# Patient Record
Sex: Male | Born: 1970 | Race: White | Hispanic: Yes | Marital: Married | Smoking: Current every day smoker
Health system: Southern US, Community
[De-identification: ages and names within clinical notes are randomized; demographics above are authoritative.]

## PROBLEM LIST (undated history)

## (undated) DIAGNOSIS — B353 Tinea pedis: Secondary | ICD-10-CM

## (undated) DIAGNOSIS — E119 Type 2 diabetes mellitus without complications: Secondary | ICD-10-CM

## (undated) DIAGNOSIS — E785 Hyperlipidemia, unspecified: Secondary | ICD-10-CM

## (undated) HISTORY — DX: Type 2 diabetes mellitus without complications: E11.9

## (undated) HISTORY — DX: Hyperlipidemia, unspecified: E78.5

## (undated) HISTORY — DX: Tinea pedis: B35.3

---

## 2006-08-22 ENCOUNTER — Emergency Department (HOSPITAL_COMMUNITY): Admission: EM | Admit: 2006-08-22 | Discharge: 2006-08-23 | Payer: Self-pay | Admitting: Emergency Medicine

## 2015-12-27 ENCOUNTER — Emergency Department (HOSPITAL_COMMUNITY)
Admission: EM | Admit: 2015-12-27 | Discharge: 2015-12-27 | Disposition: A | Discharge status: Home / Self Care | Payer: Self-pay | Attending: Emergency Medicine | Admitting: Emergency Medicine

## 2015-12-27 ENCOUNTER — Encounter (HOSPITAL_COMMUNITY): Payer: Self-pay | Admitting: Emergency Medicine

## 2015-12-27 ENCOUNTER — Emergency Department (HOSPITAL_COMMUNITY): Payer: Self-pay

## 2015-12-27 DIAGNOSIS — Y998 Other external cause status: Secondary | ICD-10-CM | POA: Insufficient documentation

## 2015-12-27 DIAGNOSIS — Z23 Encounter for immunization: Secondary | ICD-10-CM | POA: Insufficient documentation

## 2015-12-27 DIAGNOSIS — S0101XA Laceration without foreign body of scalp, initial encounter: Secondary | ICD-10-CM | POA: Insufficient documentation

## 2015-12-27 DIAGNOSIS — S0191XA Laceration without foreign body of unspecified part of head, initial encounter: Secondary | ICD-10-CM

## 2015-12-27 DIAGNOSIS — Y9389 Activity, other specified: Secondary | ICD-10-CM | POA: Insufficient documentation

## 2015-12-27 DIAGNOSIS — Y92009 Unspecified place in unspecified non-institutional (private) residence as the place of occurrence of the external cause: Secondary | ICD-10-CM | POA: Insufficient documentation

## 2015-12-27 DIAGNOSIS — F17200 Nicotine dependence, unspecified, uncomplicated: Secondary | ICD-10-CM | POA: Insufficient documentation

## 2015-12-27 MED ORDER — NAPROXEN 500 MG PO TABS: 500.0000 mg | ORAL_TABLET | Freq: Two times a day (BID) | ORAL | Status: DC

## 2015-12-27 MED ORDER — TETANUS-DIPHTH-ACELL PERTUSSIS 5-2.5-18.5 LF-MCG/0.5 IM SUSP
0.5000 mL | Freq: Once | INTRAMUSCULAR | Status: AC
  Administered 2015-12-27: 0.5 mL via INTRAMUSCULAR
  Filled 2015-12-27: qty 0.5

## 2015-12-27 MED ORDER — HYDROCODONE-ACETAMINOPHEN 5-325 MG PO TABS: 1.0000 | ORAL_TABLET | ORAL | Status: DC | PRN

## 2015-12-27 MED ORDER — HYDROCODONE-ACETAMINOPHEN 5-325 MG PO TABS
1.0000 | ORAL_TABLET | Freq: Once | ORAL | Status: AC
Start: 2015-12-27 — End: 2015-12-27
  Administered 2015-12-27: 1 via ORAL
  Filled 2015-12-27: qty 1
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
## 2015-12-27 NOTE — ED Notes (Signed)
[facility]  Pt calling for transport

## 2015-12-27 NOTE — Discharge Instructions (Signed)
Please return to the emergency room or go to any urgent care in one week for removal of the two (2) staples on your scalp. Return to the ER for new or worsening symptoms.   Puntos, grapas o Elijah Garcia para cerrar heridas (Stitches, Staples, or Adhesive Wound Closure) Los [employer] puntos (suturas), las grapas y ciertos pegamentos Sondra Barges cutneos) para Pharmacologist unida la piel mientras cicatriza (cierre de la herida). Tal vez sea necesario este tratamiento despus de [place] o si se produce un corte accidental en la piel. Estos mtodos ayudan a que la piel cicatrice con mayor rapidez y reducen la probabilidad de que se forme una Training and development officer. Una herida puede demorar varios meses en cicatrizar por completo. El tipo de herida que tenga determina cundo debe cerrarse. En la [employer], la herida se cierra lo antes posible (cierre definitivo de la piel). A veces, el cierre se retrasa para poder limpiar la herida y dejar que cicatrice naturalmente. Esto reduce la probabilidad de infecciones. Puede ser necesario retardar el cierre si la herida:  Es causada por una mordedura o [place].  Se produjo hace ms de 6horas.  Hay prdida de piel o de tejidos subcutneos.  Hay suciedad o partculas que no pueden retirarse.  Est infectada. CULES SON LAS DIFERENTES CLASES DE CIERRES DE HERIDAS? Hay muchas opciones para cerrar heridas. La alternativa que el mdico [place] depende de la profundidad y el tamao de la herida. Maryln Gottron cerrar Neomia Dear herida con este tipo de [place], el mdico mantiene [employer] bordes de la herida y Microbiologist una capa del producto en la superficie de la piel. Tal vez se necesite ms de una capa de [place]. Luego, la herida puede cubrirse con un apsito (vendaje) pequeo. Este tipo de cierre de la piel puede usarse para las heridas pequeas que no son profundas (superficiales). Usar [employer] para cerrar heridas es menos doloroso que otros mtodos y no requiere  de la aplicacin de un medicamento para anestesiar la zona (anestesia local). Adems, con este mtodo, no hay nada que retirar. El adhesivo suele usarse en los nios y en las heridas de la cara. Esta opcin no puede usarse en las heridas profundas, irregulares o que sangran. No se aplica en el interior de una herida.  Tiras ZOXWRUEAV Estas tiras estn hechas de papel engomado Elijah Garcia) y poroso. Se colocan cruzadas [employer] bordes de la piel, como un vendaje adhesivo normal. Se dejan hasta que se despegan. Las tiras [facility] [facility] pueden usarse para cerrar heridas muy superficiales. Tambin pueden utilizarse junto con suturas para Scientist, clinical (histocompatibility and immunogenetics) el cierre de los bordes de la piel.  Suturas Las suturas son el mtodo ms antiguo para Automotive engineer las heridas. El material de sutura puede estar hecho de sustancias biolgicas, como la seda, o de materiales sintticos, como el nailon y Civil engineer, contracting. Puede estar hecho de un material que el cuerpo puede degradar a medida que la herida cicatriza (reabsorbible), o bien, de un material que debe retirarse de la piel (no reabsorbible). Tiene muchas resistencias y Therapist, music. El mdico enhebra el hilo de sutura en un extremo de una aguja de acero. Las suturas pueden pasarse a travs de la piel o los tejidos subcutneos. Luego se atan y se cortan. Los bordes de la piel pueden cerrarse con un punto continuo o con puntos separados. Las suturas son resistentes y pueden usarse para todo tipo de herida. Las suturas reabsorbibles pueden utilizarse para cerrar los tejidos subcutneos. La desventaja de las suturas  es que pueden causar reacciones en la piel que derivan en infecciones. Las suturas no reabsorbibles deben retirarse. Grapas Cuando se usan grapas quirrgicas para cerrar una herida, se acercan los bordes de la piel de ambos lados de la herida y se Music therapistcierran. Se coloca una grapa a travs de la herida, y, con un instrumento, se abrochan los bordes. Las grapas suelen usarse para  cerrar cortes quirrgicos (incisiones). Se colocan con mayor rapidez que las suturas y causan menos reacciones en la piel. Deben retirarse con una herramienta que las abre y permite quitarlas de la piel. CMO DEBO CUIDARME EL CIERRE DE LA HERIDA?  Tome los medicamentos solamente como se lo haya indicado el mdico.  Si le recetaron antibiticos para la herida, asegrese de terminarlos, incluso si comienza a sentirse mejor.  Aplique ungentos o cremas como se lo haya indicado el mdico.  Lvese las manos con agua y [place], antes y despus de tocarse la herida.  No sumerja la herida en agua. No tome baos de inmersin, no nade ni use el jacuzzi hasta que el mdico lo autorice.  Pregntele al mdico cundo puede empezar a ducharse. Cbrase la herida, si el mdico se lo indic.  No se quite las suturas ni las grapas.  No se toque la herida. Esto puede causar una infeccin.  Concurra a todas las visitas de control como se lo haya indicado el mdico. Esto es importante. CUNTO TIEMPO TENDR EL CIERRE DE LA HERIDA?  Djese el Elijah Danielsadhesivo en la piel hasta que se desprenda solo.  Djese las tiras Agilent Technologiesadhesivas en la piel hasta que se despeguen.  Las suturas reabsorbibles se Administrator, sportsdisolvern en el trmino de Time Warneralgunos das.  Las suturas no reabsorbibles y las grapas deben retirarse. El Environmental consultantlugar de la herida Warehouse managerdeterminar el tiempo que deben dejarse, el cual puede variar entre [address]varios das y un par de Retail buyersemanas. CUNDO DEBO SOLICITAR AYUDA PARA EL CIERRE DE LA HERIDA? Comunquese con su mdico si:  Tiene fiebre.  Tiene escalofros.  Hay secrecin, enrojecimiento, hinchazn o dolor en la herida.  Advierte un olor ftido que proviene de la herida.  Los bordes de la piel empiezan a separarse despus de retiradas las suturas.  La herida se engrosa, se abulta y se oscurece despus de retiradas las suturas (deformidad cicatricial).   Esta informacin no tiene Theme park managercomo fin reemplazar el consejo del mdico.  Asegrese de hacerle al mdico cualquier pregunta que tenga.   Document Released: 06/08/2005 Document Revised: 09/19/2014 Elsevier Interactive Patient Education [employer]2016 Elsevier Inc.
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
## 2018-09-16 ENCOUNTER — Emergency Department (HOSPITAL_COMMUNITY): Payer: Self-pay

## 2018-09-16 ENCOUNTER — Other Ambulatory Visit: Payer: Self-pay

## 2018-09-16 ENCOUNTER — Encounter (HOSPITAL_COMMUNITY): Payer: Self-pay

## 2018-09-16 ENCOUNTER — Emergency Department (HOSPITAL_COMMUNITY)
Admission: EM | Admit: 2018-09-16 | Discharge: 2018-09-17 | Disposition: A | Discharge status: Home / Self Care | Payer: Self-pay | Attending: Emergency Medicine | Admitting: Emergency Medicine

## 2018-09-16 DIAGNOSIS — F17210 Nicotine dependence, cigarettes, uncomplicated: Secondary | ICD-10-CM | POA: Insufficient documentation

## 2018-09-16 DIAGNOSIS — E119 Type 2 diabetes mellitus without complications: Secondary | ICD-10-CM | POA: Insufficient documentation

## 2018-09-16 DIAGNOSIS — N200 Calculus of kidney: Secondary | ICD-10-CM | POA: Insufficient documentation

## 2018-09-16 DIAGNOSIS — Z79899 Other long term (current) drug therapy: Secondary | ICD-10-CM | POA: Insufficient documentation

## 2018-09-16 HISTORY — DX: Type 2 diabetes mellitus without complications: E11.9

## 2018-09-16 LAB — URINALYSIS, ROUTINE W REFLEX MICROSCOPIC
Bilirubin Urine: NEGATIVE
Ketones, ur: NEGATIVE mg/dL
Leukocytes, UA: NEGATIVE
Nitrite: NEGATIVE
PROTEIN: 30 mg/dL — AB
SPECIFIC GRAVITY, URINE: 1.032 — AB (ref 1.005–1.030)
pH: 6 (ref 5.0–8.0)

## 2018-09-16 LAB — COMPREHENSIVE METABOLIC PANEL
ALBUMIN: 4.1 g/dL (ref 3.5–5.0)
ALK PHOS: 56 U/L (ref 38–126)
ALT: 36 U/L (ref 0–44)
AST: 29 U/L (ref 15–41)
Anion gap: 7 (ref 5–15)
BILIRUBIN TOTAL: 0.7 mg/dL (ref 0.3–1.2)
BUN: 12 mg/dL (ref 6–20)
CALCIUM: 8.9 mg/dL (ref 8.9–10.3)
CO2: 23 mmol/L (ref 22–32)
CREATININE: 1.01 mg/dL (ref 0.61–1.24)
Chloride: 103 mmol/L (ref 98–111)
GFR calc Af Amer: >60 mL/min (ref >60)
GFR calc non Af Amer: >60 mL/min (ref >60)
GLUCOSE: 325 mg/dL — AB (ref 70–99)
Potassium: 4.3 mmol/L (ref 3.5–5.1)
SODIUM: 133 mmol/L — AB (ref 135–145)
Total Protein: 7.5 g/dL (ref 6.5–8.1)

## 2018-09-16 LAB — LIPASE, BLOOD: Lipase: 31 U/L (ref 11–51)

## 2018-09-16 LAB — CBC
HCT: 46.2 % (ref 39.0–52.0)
Hemoglobin: 15.7 g/dL (ref 13.0–17.0)
MCH: 30.5 pg (ref 26.0–34.0)
MCHC: 34 g/dL (ref 30.0–36.0)
MCV: 89.7 fL (ref 80.0–100.0)
PLATELETS: 269 10*3/uL (ref 150–400)
RBC: 5.15 MIL/uL (ref 4.22–5.81)
RDW: 12.4 % (ref 11.5–15.5)
WBC: 6.2 10*3/uL (ref 4.0–10.5)
nRBC: 0 % (ref 0.0–0.2)

## 2018-09-16 MED ORDER — IOHEXOL 300 MG/ML  SOLN
100.0000 mL | Freq: Once | INTRAMUSCULAR | Status: AC | PRN
  Administered 2018-09-16: 100 mL via INTRAVENOUS

## 2018-09-16 MED ORDER — ONDANSETRON HCL 4 MG/2ML IJ SOLN
4.0000 mg | Freq: Once | INTRAMUSCULAR | Status: AC
  Administered 2018-09-16: 4 mg via INTRAVENOUS
  Filled 2018-09-16: qty 2

## 2018-09-16 MED ORDER — SODIUM CHLORIDE 0.9 % IV SOLN
1.0000 g | Freq: Once | INTRAVENOUS | Status: AC
  Administered 2018-09-16: 1 g via INTRAVENOUS
  Filled 2018-09-16: qty 10

## 2018-09-16 MED ORDER — SODIUM CHLORIDE 0.9 % IV BOLUS
1000.0000 mL | Freq: Once | INTRAVENOUS | Status: AC
  Administered 2018-09-16: 1000 mL via INTRAVENOUS

## 2018-09-16 MED ORDER — MORPHINE SULFATE (PF) 4 MG/ML IV SOLN
4.0000 mg | Freq: Once | INTRAVENOUS | Status: AC
  Administered 2018-09-16: 4 mg via INTRAVENOUS
  Filled 2018-09-16: qty 1

## 2018-09-16 MED ORDER — OXYCODONE-ACETAMINOPHEN 5-325 MG PO TABS
2.0000 | ORAL_TABLET | Freq: Once | ORAL | Status: AC
  Administered 2018-09-16: 2 via ORAL
  Filled 2018-09-16: qty 2

## 2018-09-16 NOTE — ED Triage Notes (Signed)
Pt arrives POV for eval of abd pain w/ blt flank pain onset last night. Pt reports 7 episodes of emesis since yesterday afternoon. Pt denies dysuria, hematuria. Denies CP/SOB.

## 2018-09-16 NOTE — ED Provider Notes (Signed)
[facility] EMERGENCY DEPARTMENT Provider Note   CSN: [id number] Arrival date & time: 09/16/18  1640     History   Chief Complaint Chief Complaint  Patient presents with  . Abdominal Pain    HPI Elijah Garcia is a 48 y.o. male.  The history is provided by the patient and medical records. No language interpreter was used.  Abdominal Pain   Associated symptoms include nausea and vomiting. Pertinent negatives include diarrhea, constipation, dysuria, frequency and hematuria.   Elijah Garcia is a 48 y.o. male  with a PMH of DM who presents to the Emergency Department complaining of generalized abdominal pain which began yesterday.  Reports bilateral flank/back pain as well which is described as aching. Associated with nausea as well as vomiting.  He states that he threw up about 4 times yesterday and about 3 times thus far today.  He took Tylenol with little improvement.  Denies any history of similar.  No history of abdominal surgeries.  Denies any chest pain, shortness of breath, dysuria.  Denies any aggravating factors.  Past Medical History:  Diagnosis Date  . Diabetes mellitus without complication (HCC)     There are no active problems to display for this patient.   History reviewed. No pertinent surgical history.      Home Medications    Prior to Admission medications   Medication Sig Start Date End Date Taking? Authorizing Provider  HYDROcodone-acetaminophen (NORCO/VICODIN) 5-325 MG tablet Take 1 tablet by mouth every 4 (four) hours as needed. 12/27/15   Sam, Ace Gins, PA-C  ibuprofen (ADVIL,MOTRIN) 200 MG tablet Take 400 mg by mouth every 6 (six) hours as needed (for pain.).    [provider]  naproxen (NAPROSYN) 500 MG tablet Take 1 tablet (500 mg total) by mouth 2 (two) times daily. 12/27/15   Carlene Coria, PA-C    Family History History reviewed. No pertinent family history.  Social History Social History    Tobacco Use  . Smoking status: Current Every Day Smoker  . Smokeless tobacco: Never Used  Substance Use Topics  . Alcohol use: Yes  . Drug use: No     Allergies   Patient has no known allergies.   Review of Systems Review of Systems  Gastrointestinal: Positive for abdominal pain, nausea and vomiting. Negative for blood in stool, constipation and diarrhea.  Genitourinary: Positive for flank pain. Negative for difficulty urinating, discharge, dysuria, frequency, hematuria, penile pain, penile swelling, scrotal swelling, testicular pain and urgency.  Musculoskeletal: Positive for back pain. Negative for neck pain.  All other systems reviewed and are negative.    Physical Exam Updated Vital Signs BP (!) 142/89 (BP Location: Right Arm)   Pulse 62   Temp 98.3 F (36.8 C) (Oral)   Resp 18   Ht 5' 6.54" (1.69 m)   Wt 86.2 kg   SpO2 99%   BMI 30.18 kg/m   Physical Exam Vitals signs and nursing note reviewed.  Constitutional:      General: He is not in acute distress.    Appearance: He is well-developed.  HENT:     Head: Normocephalic and atraumatic.  Cardiovascular:     Rate and Rhythm: Normal rate and regular rhythm.     Heart sounds: Normal heart sounds. No murmur.  Pulmonary:     Effort: Pulmonary effort is normal. No respiratory distress.     Breath sounds: Normal breath sounds.  Abdominal:     General: There is no  distension.     Palpations: Abdomen is soft.     Comments: Generalized abdominal tenderness.  Skin:    General: Skin is warm and dry.  Neurological:     Mental Status: He is alert and oriented to person, place, and time.      ED Treatments / Results  Labs (all labs ordered are listed, but only abnormal results are displayed) Labs Reviewed  COMPREHENSIVE METABOLIC PANEL - Abnormal; Notable for the following components:      Result Value   Sodium 133 (*)    Glucose, Bld 325 (*)    All other components within normal limits  URINALYSIS,  ROUTINE W REFLEX MICROSCOPIC - Abnormal; Notable for the following components:   Specific Gravity, Urine 1.032 (*)    Glucose, UA >=500 (*)    Hgb urine dipstick LARGE (*)    Protein, ur 30 (*)    RBC / HPF >50 (*)    Bacteria, UA RARE (*)    All other components within normal limits  LIPASE, BLOOD  CBC    EKG None  Radiology No results found.  Procedures Procedures (including critical care time)  Medications Ordered in ED Medications  morphine 4 MG/ML injection 4 mg (4 mg Intravenous Given 09/16/18 1754)  ondansetron (ZOFRAN) injection 4 mg (4 mg Intravenous Given 09/16/18 1755)  sodium chloride 0.9 % bolus 1,000 mL (0 mLs Intravenous Stopped 09/16/18 2057)     Initial Impression / Assessment and Plan / ED Course  I have reviewed the triage vital signs and the nursing notes.  Pertinent labs & imaging results that were available during my care of the patient were reviewed by me and considered in my medical decision making (see chart for details).    Elijah Garcia is a 48 y.o. male who presents to ED for abdominal pain, nausea and vomiting which began yesterday. On exam, and is afebrile, hemodynamically stable with generalized abdominal tenderness.  Labs reviewed and notable for hyperglycemia at 325 without evidence of DKA.  He does have history of diabetes.  Normal white count.  Urinalysis with large amount of hemoglobin and red blood cells.  Possible kidney stone as etiology. CT abd/pelvis pending at shift change. Care assumed by attending, Dr. Donnald Garre who will follow up on pending imaging and dispo appropriately.    Final Clinical Impressions(s) / ED Diagnoses   Final diagnoses:  None    ED Discharge Orders    None       Ward, Chase Picket, PA-C 09/16/18 2155    Arby Barrette, MD 09/20/18 [phone]

## 2018-09-16 NOTE — ED Notes (Signed)
Patient transported to CT 

## 2018-09-17 MED ORDER — CEPHALEXIN 500 MG PO CAPS: 1000.0000 mg | ORAL_CAPSULE | Freq: Two times a day (BID) | ORAL | 0 refills | Status: DC

## 2018-09-17 MED ORDER — OXYCODONE-ACETAMINOPHEN 5-325 MG PO TABS: 1.0000 | ORAL_TABLET | Freq: Four times a day (QID) | ORAL | 0 refills | Status: DC | PRN

## 2018-09-17 MED ORDER — ONDANSETRON 4 MG PO TBDP: 4.0000 mg | ORAL_TABLET | ORAL | 0 refills | Status: DC | PRN

## 2018-09-17 NOTE — Discharge Instructions (Addendum)
1.  Call Dr. Alphonsa Overall office tomorrow morning at [phone]. This is a Insurance underwriter. You need an appointment in the next 1-3 days.  Dr. Liliane Shi is the doctor who was called when you were in the emergency department.  You can be seen by the first available doctor at the urologist office. 2.  You may take Percocet as prescribed for pain.  Take Keflex, an antibiotic twice daily as prescribed.  You were also given prescription of Zofran which is for nausea. 3.  If your symptoms are getting worse or you feel that your pain is getting worse, return to the emergency department.

## 2018-09-18 LAB — URINE CULTURE: Culture: NO GROWTH

## 2019-05-23 ENCOUNTER — Other Ambulatory Visit: Payer: Self-pay | Admitting: Pediatrics

## 2019-05-23 MED ORDER — PERMETHRIN 5 % EX CREA: 1.0000 "application " | TOPICAL_CREAM | Freq: Once | CUTANEOUS | 0 refills | Status: AC

## 2019-05-23 NOTE — Progress Notes (Signed)
Household contact of scabies  

## 2019-07-21 ENCOUNTER — Emergency Department (HOSPITAL_COMMUNITY): Payer: Self-pay

## 2019-07-21 ENCOUNTER — Emergency Department (HOSPITAL_COMMUNITY)
Admission: EM | Admit: 2019-07-21 | Discharge: 2019-07-21 | Disposition: A | Discharge status: Home / Self Care | Payer: Self-pay | Attending: Emergency Medicine | Admitting: Emergency Medicine

## 2019-07-21 ENCOUNTER — Other Ambulatory Visit: Payer: Self-pay

## 2019-07-21 ENCOUNTER — Encounter (HOSPITAL_COMMUNITY): Payer: Self-pay

## 2019-07-21 DIAGNOSIS — Z7984 Long term (current) use of oral hypoglycemic drugs: Secondary | ICD-10-CM | POA: Insufficient documentation

## 2019-07-21 DIAGNOSIS — E119 Type 2 diabetes mellitus without complications: Secondary | ICD-10-CM | POA: Insufficient documentation

## 2019-07-21 DIAGNOSIS — F17210 Nicotine dependence, cigarettes, uncomplicated: Secondary | ICD-10-CM | POA: Insufficient documentation

## 2019-07-21 DIAGNOSIS — L600 Ingrowing nail: Secondary | ICD-10-CM | POA: Insufficient documentation

## 2019-07-21 LAB — BASIC METABOLIC PANEL
Anion gap: 14 (ref 5–15)
BUN: 16 mg/dL (ref 6–20)
CO2: 18 mmol/L — ABNORMAL LOW (ref 22–32)
Calcium: 9.1 mg/dL (ref 8.9–10.3)
Chloride: 102 mmol/L (ref 98–111)
Creatinine, Ser: 0.64 mg/dL (ref 0.61–1.24)
GFR calc Af Amer: >60 mL/min (ref >60)
GFR calc non Af Amer: >60 mL/min (ref >60)
Glucose, Bld: 435 mg/dL — ABNORMAL HIGH (ref 70–99)
Potassium: 4 mmol/L (ref 3.5–5.1)
Sodium: 134 mmol/L — ABNORMAL LOW (ref 135–145)

## 2019-07-21 LAB — CBC WITH DIFFERENTIAL/PLATELET
Abs Immature Granulocytes: 0.01 10*3/uL (ref 0.00–0.07)
Basophils Absolute: 0 10*3/uL (ref 0.0–0.1)
Basophils Relative: 1 %
Eosinophils Absolute: 0 10*3/uL (ref 0.0–0.5)
Eosinophils Relative: 1 %
HCT: 47.9 % (ref 39.0–52.0)
Hemoglobin: 16.9 g/dL (ref 13.0–17.0)
Immature Granulocytes: 0 %
Lymphocytes Relative: 37 %
Lymphs Abs: 1.4 10*3/uL (ref 0.7–4.0)
MCH: 31.4 pg (ref 26.0–34.0)
MCHC: 35.3 g/dL (ref 30.0–36.0)
MCV: 88.9 fL (ref 80.0–100.0)
Monocytes Absolute: 0.2 10*3/uL (ref 0.1–1.0)
Monocytes Relative: 6 %
Neutro Abs: 2 10*3/uL (ref 1.7–7.7)
Neutrophils Relative %: 55 %
Platelets: 240 10*3/uL (ref 150–400)
RBC: 5.39 MIL/uL (ref 4.22–5.81)
RDW: 11.9 % (ref 11.5–15.5)
WBC: 3.7 10*3/uL — ABNORMAL LOW (ref 4.0–10.5)
nRBC: 0 % (ref 0.0–0.2)

## 2019-07-21 MED ORDER — METFORMIN HCL 500 MG PO TABS: 500.0000 mg | ORAL_TABLET | Freq: Two times a day (BID) | ORAL | 0 refills | Status: DC

## 2019-07-21 MED ORDER — LIDOCAINE HCL (PF) 1 % IJ SOLN
30.0000 mL | Freq: Once | INTRAMUSCULAR | Status: AC
  Administered 2019-07-21: 13:00:00 30 mL
  Filled 2019-07-21: qty 30

## 2019-07-21 MED ORDER — CEPHALEXIN 250 MG PO CAPS
500.0000 mg | ORAL_CAPSULE | Freq: Once | ORAL | Status: AC
  Administered 2019-07-21: 500 mg via ORAL
  Filled 2019-07-21: qty 2

## 2019-07-21 MED ORDER — ACETAMINOPHEN 325 MG PO TABS
650.0000 mg | ORAL_TABLET | Freq: Once | ORAL | Status: AC
  Administered 2019-07-21: 12:00:00 650 mg via ORAL
  Filled 2019-07-21: qty 2

## 2019-07-21 MED ORDER — METFORMIN HCL 500 MG PO TABS
500.0000 mg | ORAL_TABLET | Freq: Once | ORAL | Status: AC
  Administered 2019-07-21: 500 mg via ORAL
  Filled 2019-07-21: qty 1

## 2019-07-21 MED ORDER — CEPHALEXIN 500 MG PO CAPS: 1000.0000 mg | ORAL_CAPSULE | Freq: Two times a day (BID) | ORAL | 0 refills | Status: AC

## 2019-07-21 NOTE — Discharge Instructions (Addendum)
You have been seen today for tow infection. Please read and follow all provided instructions. Return to the emergency room for worsening condition or new concerning symptoms including fever, chills, worsening pain, surrounding redness or purulent drainage  1. Medications:  -Prescription sent to your pharmacy for Keflex.  This is an antibiotic for your toe infection.  Please take as prescribed. -Prescription also sent for Metformin.  Please start taking this medication for your diabetes. It is important that you start checking your blood sugars daily  Continue usual home medications Take medications as prescribed. Please review all of the medicines and only take them if you do not have an allergy to them.   2. Treatment: rest, drink plenty of fluids. Warm water soaks for your toe twice daily 3. Follow Up: Please follow-up with the [facility] health clinic as we discussed.  You are given a flyer today and you need to call on Monday to schedule an appointment.  They see patients without insurance. -Please also follow-up with the doctor Dr. Amalia Hailey.  Your toe is infected today and will likely need further treatment.  It is also a possibility that you have an allergic reaction to any of the medicines that you have been prescribed - Everybody reacts differently to medications and while MOST people have no trouble with most medicines, you may have a reaction such as nausea, vomiting, rash, swelling, shortness of breath. If this is the case, please stop taking the medicine immediately and contact your physician.  ?

## 2019-07-21 NOTE — ED Triage Notes (Signed)
Patient complains of left great toe pain x 2 weeks, complains of pain around nail bed, denies trauma

## 2019-07-21 NOTE — ED Notes (Signed)
Patient verbalizes understanding of discharge instructions . Opportunity for questions and answers were provided . Armband removed by staff ,Pt discharged from ED. W/C  offered at D/C  and Declined W/C at D/C and was escorted to lobby by RN.  

## 2019-07-21 NOTE — ED Notes (Signed)
ED Provider at bedside. 

## 2019-07-21 NOTE — ED Provider Notes (Signed)
[facility]   CSN: [id number] Arrival date & time: 07/21/19  1038     History   Chief Complaint Foot pain  HPI Elijah Garcia is a 48 y.o. male with past medical history significant for diabetes presents to emergency department today with chief complaint of foot pain x2 weeks.  Pain is located in his left great toe.  He describes the pain as intermittent, throbbing, worse with movement.  He has had purulent drainage from wound. He rates pain 8/10 in severity. He took ibuprofen without symptom relief.  He denies any trauma to the toe or injury. He has not taken his Metformin in the last 3 or so months because he ran out, he has not been checking his blood sugars.   He denies fever, chills, chest pain, abdominal pain, nausea, vomiting, urinary symptoms, diarrhea, numbness, weakness.       Past Medical History:  Diagnosis Date  . Diabetes mellitus without complication (HCC)     There are no active problems to display for this patient.   History reviewed. No pertinent surgical history.      Home Medications    Prior to Admission medications   Medication Sig Start Date End Date Taking? Authorizing Provider  cephALEXin (KEFLEX) 500 MG capsule Take 2 capsules (1,000 mg total) by mouth 2 (two) times daily for 7 days. 07/21/19 07/28/19  Keliyah Spillman E, PA-C  HYDROcodone-acetaminophen (NORCO/VICODIN) 5-325 MG tablet Take 1 tablet by mouth every 4 (four) hours as needed. Patient not taking: Reported on 09/16/2018 12/27/15   Sam, Ace GinsSerena Y, PA-C  ibuprofen (ADVIL,MOTRIN) 200 MG tablet Take 400 mg by mouth every 6 (six) hours as needed (pain).     [provider]  metFORMIN (GLUCOPHAGE) 500 MG tablet Take 1 tablet (500 mg total) by mouth 2 (two) times daily with a meal. 07/21/19 08/20/19  Latonia Conrow E, PA-C  naproxen (NAPROSYN) 500 MG tablet Take 1 tablet (500 mg total) by mouth 2 (two) times daily. Patient  not taking: Reported on 09/16/2018 12/27/15   Sam, Ace GinsSerena Y, PA-C  ondansetron (ZOFRAN ODT) 4 MG disintegrating tablet Take 1 tablet (4 mg total) by mouth every 4 (four) hours as needed for nausea or vomiting. 09/17/18   Arby BarrettePfeiffer, Marcy, MD  oxyCODONE-acetaminophen (PERCOCET) 5-325 MG tablet Take 1-2 tablets by mouth every 6 (six) hours as needed. 09/17/18   Arby BarrettePfeiffer, Marcy, MD    Family History No family history on file.  Social History Social History   Tobacco Use  . Smoking status: Current Every Day Smoker  . Smokeless tobacco: Never Used  Substance Use Topics  . Alcohol use: Yes  . Drug use: No     Allergies   Patient has no known allergies.   Review of Systems Review of Systems  Constitutional: Negative for chills, diaphoresis, fatigue and fever.  Respiratory: Negative for cough and shortness of breath.   Cardiovascular: Negative for chest pain.  Gastrointestinal: Negative for abdominal pain, nausea and vomiting.  Endocrine: Negative for polydipsia, polyphagia and polyuria.  Musculoskeletal: Positive for arthralgias.  Skin: Positive for wound.  Allergic/Immunologic: Positive for immunocompromised state (diabetic).  Neurological: Negative for weakness and numbness.     Physical Exam Updated Vital Signs BP (!) 142/104 (BP Location: Right Arm)   Pulse 90   Temp 98.2 F (36.8 C) (Oral)   Resp 12   SpO2 100%   Physical Exam Vitals signs and nursing note reviewed.  Constitutional:  Appearance: He is well-developed. He is not ill-appearing or toxic-appearing.  HENT:     Head: Normocephalic and atraumatic.     Nose: Nose normal.  Eyes:     General: No scleral icterus.       Right eye: No discharge.        Left eye: No discharge.     Conjunctiva/sclera: Conjunctivae normal.  Neck:     Musculoskeletal: Normal range of motion.     Vascular: No JVD.  Cardiovascular:     Rate and Rhythm: Normal rate and regular rhythm.     Pulses: Normal pulses.          Dorsalis  pedis pulses are 2+ on the right side and 2+ on the left side.     Heart sounds: Normal heart sounds.  Pulmonary:     Effort: Pulmonary effort is normal.     Breath sounds: Normal breath sounds.  Abdominal:     General: There is no distension.  Musculoskeletal: Normal range of motion.  Feet:     Right foot:     Skin integrity: Callus and dry skin present.     Toenail Condition: Right toenails are abnormally thick.     Left foot:     Skin integrity: Callus and dry skin present.     Toenail Condition: Left toenails are abnormally thick.     Comments: Sensation intact to bilateral feet.  Able to wiggle toes, cap refill.  Wound to medial aspect of left great toe nail with minimal bleeding and purulent drainage. Tender to palpation, warm to the touch. Please see media below Skin:    General: Skin is warm and dry.  Neurological:     Mental Status: He is oriented to person, place, and time.     GCS: GCS eye subscore is 4. GCS verbal subscore is 5. GCS motor subscore is 6.     Comments: Fluent speech, no facial droop.  Psychiatric:        Behavior: Behavior normal.        ED Treatments / Results  Labs (all labs ordered are listed, but only abnormal results are displayed) Labs Reviewed  CBC WITH DIFFERENTIAL/PLATELET - Abnormal; Notable for the following components:      Result Value   WBC 3.7 (*)    All other components within normal limits  BASIC METABOLIC PANEL - Abnormal; Notable for the following components:   Sodium 134 (*)    CO2 18 (*)    Glucose, Bld 435 (*)    All other components within normal limits    EKG None  Radiology Dg Foot Complete Left  Result Date: 07/21/2019 CLINICAL DATA:  Patient complains of left great toe pain x 2 weeks, complains of pain around nail bed, denies trauma. EXAM: LEFT FOOT - COMPLETE 3+ VIEW COMPARISON:  None. FINDINGS: There is no evidence of fracture or dislocation. There is no evidence of arthropathy or other focal bone abnormality.  Soft tissues are unremarkable. IMPRESSION: No radiographic finding to explain the patient's left great toe pain. Electronically Signed   By: Emmaline Kluver M.D.   On: 07/21/2019 12:24    Procedures .Marland KitchenIncision and Drainage  Date/Time: 07/21/2019 1:34 PM Performed by: Sherene Sires, PA-C Authorized by: Sherene Sires, PA-C   Consent:    Consent obtained:  Verbal   Consent given by:  Patient   Risks discussed:  Bleeding, incomplete drainage, pain and infection   Alternatives discussed:  No treatment Location:  Type:  Fluid collection   Size:  .5 x .5   Location:  Lower extremity   Lower extremity location:  Toe   Toe location:  L big toe Pre-procedure details:    Skin preparation:  Chloraprep Anesthesia (see MAR for exact dosages):    Anesthesia method:  Local infiltration   Local anesthetic:  Lidocaine 1% w/o epi Procedure type:    Complexity:  Simple Procedure details:    Incision types:  Single straight   Incision depth:  Dermal   Scalpel blade:  11   Wound management:  Probed and deloculated, irrigated with saline and extensive cleaning   Drainage:  Purulent   Drainage amount:  Scant   Wound treatment:  Wound left open   Packing materials:  None Post-procedure details:    Patient tolerance of procedure:  Tolerated well, no immediate complications   (including critical care time)  Medications Ordered in ED Medications  cephALEXin (KEFLEX) capsule 500 mg (has no administration in time range)  metFORMIN (GLUCOPHAGE) tablet 500 mg (has no administration in time range)  acetaminophen (TYLENOL) tablet 650 mg (650 mg Oral Given 07/21/19 1224)  lidocaine (PF) (XYLOCAINE) 1 % injection 30 mL (30 mLs Infiltration Given 07/21/19 1307)     Initial Impression / Assessment and Plan / ED Course  I have reviewed the triage vital signs and the nursing notes.  Pertinent labs & imaging results that were available during my care of the patient were reviewed by me and  considered in my medical decision making (see chart for details).  Patient seen and examined. Patient well-appearing, no acute distress, nontoxic appearing.  He is afebrile.  Lungs are clear to auscultation all fields abdomen is soft nontender, no peritoneal signs.  He is willing to left great toe at the lateral aspect of nail bed assistant with ingrown toenail.  Given his diabetic history with noncompliance will get basic labs and x-ray to rule out osteo or deep space infection.  CBC unremarkable. BMP shows hyperglycemia 435, normal anion gap, normal kidney function. Xray viewed by me is negative for fracture, dislocation, doubt signs of osteo.  Discussed patient's elevated blood sugar today.  He does not want IV fluids.  He would rather restart his Metformin.  Will give dose of metformin and Keflex for toe infection.  I&D performed, please see note above.  Patient tolerated well.  Purulent discharge expressed from wound.  Patient discussed with case management Crystal as he does not have insurance or PCP.  She will give patient admitted for [facility] health clinic and [facility] aware that patient will be calling to schedule an appointment.  Patient given 1 month of Metformin at discharge as well as prescription for Keflex. The patient appears reasonably screened and/or stabilized for discharge and I doubt any other medical condition or other [facility] requiring further screening, evaluation, or treatment in the ED at this time prior to discharge. The patient is safe for discharge with strict return precautions discussed.  Discussed at length the importance of PCP follow-up to get patient's blood sugars under control.  Also given information for podiatry and recommend follow up for toe infection today.   Portions of this note were generated with Lobby[employer]. Dictation errors may occur despite best attempts at proofreading.   Final Clinical Impressions(s) / ED Diagnoses   Final diagnoses:   Ingrown nail of great toe of left foot    ED Discharge Orders         Ordered  metFORMIN (GLUCOPHAGE) 500 MG tablet  2 times daily with meals     07/21/19 1329    cephALEXin (KEFLEX) 500 MG capsule  2 times daily     07/21/19 1329           Kathyrn Lass 07/21/19 1339    Tegeler, Canary Brim, MD 07/21/19 1642

## 2019-07-21 NOTE — Care Management (Signed)
ED CM spoke with the patient at the bedside. Provided the patient with a flyer for [facility] and [facility]. Encouraged patient to call tomorrow to schedule an appointment and to inform the staff he was seen in the ED today. He agrees. Patient states he can afford to pay for his prescription for Metformin when discharged.

## 2019-07-21 NOTE — ED Notes (Signed)
Pt reports that he is a diabetic and has not been taking medication for diabetes at home.

## 2019-07-22 ENCOUNTER — Telehealth: Payer: Self-pay

## 2019-07-22 NOTE — Telephone Encounter (Signed)
Message received from Venita Sheffield, RN CM requesting a hospital follow up appointment for the patient.   Elijah Garcia, [facility] scheduler contacted patient and scheduled appt for 08/13/2019 @ [facility]

## 2019-08-13 ENCOUNTER — Ambulatory Visit (INDEPENDENT_AMBULATORY_CARE_PROVIDER_SITE_OTHER): Payer: Self-pay | Admitting: Internal Medicine

## 2019-08-13 DIAGNOSIS — E785 Hyperlipidemia, unspecified: Secondary | ICD-10-CM

## 2019-08-13 DIAGNOSIS — E1142 Type 2 diabetes mellitus with diabetic polyneuropathy: Secondary | ICD-10-CM

## 2019-08-13 DIAGNOSIS — L600 Ingrowing nail: Secondary | ICD-10-CM

## 2019-08-13 DIAGNOSIS — E1169 Type 2 diabetes mellitus with other specified complication: Secondary | ICD-10-CM | POA: Insufficient documentation

## 2019-08-13 MED ORDER — TRUEPLUS LANCETS 28G MISC: 4 refills | Status: AC

## 2019-08-13 MED ORDER — CLINDAMYCIN HCL 300 MG PO CAPS: 300.0000 mg | ORAL_CAPSULE | Freq: Three times a day (TID) | ORAL | 0 refills | Status: AC

## 2019-08-13 MED ORDER — TRUE METRIX BLOOD GLUCOSE TEST VI STRP: ORAL_STRIP | 12 refills | Status: AC

## 2019-08-13 MED ORDER — TRUE METRIX METER W/DEVICE KIT: PACK | 0 refills | Status: AC

## 2019-08-13 MED ORDER — ATORVASTATIN CALCIUM 10 MG PO TABS: 10.0000 mg | ORAL_TABLET | Freq: Every day | ORAL | 3 refills | Status: AC

## 2019-08-13 MED ORDER — METFORMIN HCL 500 MG PO TABS: 500.0000 mg | ORAL_TABLET | Freq: Two times a day (BID) | ORAL | 3 refills | Status: DC

## 2019-08-13 MED FILL — TRUEplus LANCETS 28G MISC: 30 days supply | Qty: 100 | Fill #0

## 2019-08-13 MED FILL — TRUE METRIX TEST STRIP: 30 days supply | Qty: 100 | Fill #0

## 2019-08-13 MED FILL — !TRUE METRIX BLOOD GLUCOSE: 1 days supply | Qty: 1 | Fill #0

## 2019-08-13 NOTE — Progress Notes (Signed)
New patient appointment.  Following up from ED visit for ingrown toenail. States that he is taking OTC Ibuprofen for pain/swelling. He doesn't think that they took out all of the ingrown part of the nail.  Doesn't check FSBS at home. Has c/o numbness/tinging in hands & feet, polyuria. Denies nausea, vomiting, polydipsia. Does need refills on Metformin.

## 2019-08-13 NOTE — Progress Notes (Signed)
Virtual Visit via Telephone Note Due to current restrictions/limitations of in-office visits due to the COVID-19 pandemic, this scheduled clinical appointment was converted to a telehealth visit  I connected with Elijah Garcia on 08/13/19 at 11:32 a.m by telephone and verified that I am speaking with the correct person using two identifiers. I am in my office.  The patient is at home.  Only the patient, myself and [name] [id number] participated in this encounter.  I discussed the limitations, risks, security and privacy concerns of performing an evaluation and management service by telephone and the availability of in person appointments. I also discussed with the patient that there may be a patient responsible charge related to this service. The patient expressed understanding and agreed to proceed.   History of Present Illness: Pt presents for new pt visit as f/u ER visit for infected ingrown toe nail LT big toe.  Pt with hx of DM, HL.  Patient seen in the emergency room 07/21/2019 with infected ingrown toenail of the left big toe.  This was I&D.  X-ray of the toe revealed no arthropathy, fracture or bone abnormality.  Patient discharged with Keflex.  He was also noted to have elevated blood sugars in the 400s.  He gave known history of diabetes and had been out of his medication Metformin for about 3 months.  He did not have an anion gap.  Metformin was refilled.  Patient advised to follow-up with [place].  Today he reports that redness has resolved but he still having some pain on the nail itself.  Some swelling  "where th nail grows."  No drainage.  Completed abx which was Keflex 500 mg twice a day..  Rates pain 7/10.  Taking Tylenol 2x/day Hurts to wear shoes.    Pt with hx of DM and HL x 2 yrs.  He was followed by some other clinic in [place] but has not been seen there in quite a while.  He was on Metformin but out of med x 3 mths and cholesterol lowering med x 6 mth prior to ER visit.    -reports nutrition teaching in past.  He is not able to tell me what his last A1c was. -currently exercises by running for 30 mins daily -never had eye exam -endorses numbness in toes and fingers intermittently.     Observations/Objective: Results for orders placed or performed during the hospital encounter of 07/21/19  CBC with Differential  Result Value Ref Range   WBC 3.7 (L) 4.0 - 10.5 K/uL   RBC 5.39 4.22 - 5.81 MIL/uL   Hemoglobin 16.9 13.0 - 17.0 g/dL   HCT 47.9 39.0 - 52.0 %   MCV 88.9 80.0 - 100.0 fL   MCH 31.4 26.0 - 34.0 pg   MCHC 35.3 30.0 - 36.0 g/dL   RDW 11.9 11.5 - 15.5 %   Platelets 240 150 - 400 K/uL   nRBC 0.0 0.0 - 0.2 %   Neutrophils Relative % 55 %   Neutro Abs 2.0 1.7 - 7.7 K/uL   Lymphocytes Relative 37 %   Lymphs Abs 1.4 0.7 - 4.0 K/uL   Monocytes Relative 6 %   Monocytes Absolute 0.2 0.1 - 1.0 K/uL   Eosinophils Relative 1 %   Eosinophils Absolute 0.0 0.0 - 0.5 K/uL   Basophils Relative 1 %   Basophils Absolute 0.0 0.0 - 0.1 K/uL   Immature Granulocytes 0 %   Abs Immature Granulocytes 0.01 0.00 - 0.07 K/uL  Basic metabolic panel  Result Value Ref Range   Sodium 134 (L) 135 - 145 mmol/L   Potassium 4.0 3.5 - 5.1 mmol/L   Chloride 102 98 - 111 mmol/L   CO2 18 (L) 22 - 32 mmol/L   Glucose, Bld 435 (H) 70 - 99 mg/dL   BUN 16 6 - 20 mg/dL   Creatinine, Ser 0.64 0.61 - 1.24 mg/dL   Calcium 9.1 8.9 - 10.3 mg/dL   GFR calc non Af Amer >60 >60 mL/min   GFR calc Af Amer >60 >60 mL/min   Anion gap 14 5 - 15   I have looked at the image of the left toe that was taken in the emergency room  Assessment and Plan: 1. Ingrown toenail with infection -We will put him back on some antibiotics in the form of clindamycin for 7 days.  We will have him come in for an in person visit in about 2 weeks.  Follow-up sooner if any worsening.  Will give information about applying for the orange card/cone discount card on his in person visit  2. Type 2 diabetes mellitus  with diabetic polyneuropathy, without long-term current use of insulin ([place]) Highly likely it is uncontrolled. Dietary counseling given.  Advised to eliminate sugary drinks from the diet, cut back on white carbohydrates, eat more white meat instead of red meat and incorporate fresh fruits and vegetables into the diet.  Continue regular exercise.  Advised to have an eye exam done at least once a year. -Prescription written for diabetic testing supplies.  Will arrange appointment with our clinical pharmacist to be taught how to use it.  Advised to check blood sugars before breakfast and before dinner and bring in his readings on his next visit. He will stop at the laboratory at the main site when he goes to pick up his diabetic testing supplies. - glucose blood (TRUE METRIX BLOOD GLUCOSE TEST) test strip; Use as instructed  Dispense: 100 each; Refill: 12 - Blood Glucose Monitoring Suppl (TRUE METRIX METER) w/Device KIT; Use as directed  Dispense: 1 kit; Refill: 0 - TRUEplus Lancets 28G MISC; Use as directed  Dispense: 100 each; Refill: 4 - Hemoglobin A1c; Future - clindamycin (CLEOCIN) 300 MG capsule; Take 1 capsule (300 mg total) by mouth 3 (three) times daily.  Dispense: 21 capsule; Refill: 0 - Microalbumin / creatinine urine ratio; Future - metFORMIN (GLUCOPHAGE) 500 MG tablet; Take 1 tablet (500 mg total) by mouth 2 (two) times daily with a meal.  Dispense: 60 tablet; Refill: 3  3. Hyperlipidemia associated with type 2 diabetes mellitus ([place]) We will restart him on Lipitor. - Lipid panel; Future - Hepatic Function Panel; Future - atorvastatin (LIPITOR) 10 MG tablet; Take 1 tablet (10 mg total) by mouth daily.  Dispense: 30 tablet; Refill: 3   Follow Up Instructions: 2 wks in-person   I discussed the assessment and treatment plan with the patient. The patient was provided an opportunity to ask questions and all were answered. The patient agreed with the plan and demonstrated an understanding  of the instructions.  I spent 32 minutes on the line with this patient discussing diagnosis, management and coordinating care.   The patient was advised to call back or seek an in-person evaluation if the symptoms worsen or if the condition fails to improve as anticipated.  I provided 32 minutes of non-face-to-face time during this encounter.   Karle Plumber, MD

## 2019-08-26 ENCOUNTER — Other Ambulatory Visit: Payer: Self-pay

## 2019-08-26 ENCOUNTER — Encounter: Payer: Self-pay | Admitting: Pharmacist

## 2019-08-26 ENCOUNTER — Ambulatory Visit: Payer: Self-pay | Attending: Family Medicine | Admitting: Pharmacist

## 2019-08-26 DIAGNOSIS — E785 Hyperlipidemia, unspecified: Secondary | ICD-10-CM

## 2019-08-26 DIAGNOSIS — E1142 Type 2 diabetes mellitus with diabetic polyneuropathy: Secondary | ICD-10-CM

## 2019-08-26 DIAGNOSIS — Z79899 Other long term (current) drug therapy: Secondary | ICD-10-CM

## 2019-08-26 MED FILL — TRUE METRIX TEST STRIP: 30 days supply | Qty: 100 | Fill #0

## 2019-08-26 MED FILL — TRUEplus LANCETS 28G MISC: 30 days supply | Qty: 100 | Fill #0

## 2019-08-26 MED FILL — !TRUE METRIX BLOOD GLUCOSE: 1 days supply | Qty: 1 | Fill #0

## 2019-08-26 NOTE — Progress Notes (Signed)
Patient was educated on the use of the True Metrix blood glucose meter. Reviewed necessary supplies and operation of the meter. Also reviewed goal blood glucose levels. Patient was able to demonstrate use. All questions and concerns were addressed.   

## 2019-08-27 LAB — LIPID PANEL
Chol/HDL Ratio: 2.7 ratio (ref 0.0–5.0)
Cholesterol, Total: 152 mg/dL (ref 100–199)
HDL: 57 mg/dL (ref >39)
LDL Chol Calc (NIH): 67 mg/dL (ref 0–99)
Triglycerides: 166 mg/dL — ABNORMAL HIGH (ref 0–149)
VLDL Cholesterol Cal: 28 mg/dL (ref 5–40)

## 2019-08-27 LAB — HEPATIC FUNCTION PANEL
ALT: 94 IU/L — ABNORMAL HIGH (ref 0–44)
AST: 77 IU/L — ABNORMAL HIGH (ref 0–40)
Albumin: 4.8 g/dL (ref 4.0–5.0)
Alkaline Phosphatase: 111 IU/L (ref 39–117)
Bilirubin Total: 0.5 mg/dL (ref 0.0–1.2)
Bilirubin, Direct: 0.16 mg/dL (ref 0.00–0.40)
Total Protein: 7.4 g/dL (ref 6.0–8.5)

## 2019-08-27 LAB — HEMOGLOBIN A1C
Est. average glucose Bld gHb Est-mCnc: 249 mg/dL
Hgb A1c MFr Bld: 10.3 % — ABNORMAL HIGH (ref 4.8–5.6)

## 2019-08-27 LAB — MICROALBUMIN / CREATININE URINE RATIO
Creatinine, Urine: 52.5 mg/dL
Microalb/Creat Ratio: 103 mg/g creat — ABNORMAL HIGH (ref 0–29)
Microalbumin, Urine: 54 ug/mL

## 2019-08-28 ENCOUNTER — Other Ambulatory Visit: Payer: Self-pay | Admitting: Internal Medicine

## 2019-08-28 DIAGNOSIS — E1142 Type 2 diabetes mellitus with diabetic polyneuropathy: Secondary | ICD-10-CM

## 2019-08-28 MED ORDER — METFORMIN HCL 1000 MG PO TABS: 1000.0000 mg | ORAL_TABLET | Freq: Two times a day (BID) | ORAL | 4 refills | Status: AC

## 2020-03-19 ENCOUNTER — Emergency Department (HOSPITAL_COMMUNITY): Payer: Worker's Compensation

## 2020-03-19 ENCOUNTER — Other Ambulatory Visit: Payer: Self-pay

## 2020-03-19 ENCOUNTER — Encounter (HOSPITAL_COMMUNITY): Payer: Self-pay | Admitting: Emergency Medicine

## 2020-03-19 ENCOUNTER — Emergency Department (HOSPITAL_COMMUNITY)
Admission: EM | Admit: 2020-03-19 | Discharge: 2020-03-19 | Disposition: A | Discharge status: Home / Self Care | Payer: Worker's Compensation | Attending: Emergency Medicine | Admitting: Emergency Medicine

## 2020-03-19 DIAGNOSIS — Z794 Long term (current) use of insulin: Secondary | ICD-10-CM | POA: Diagnosis not present

## 2020-03-19 DIAGNOSIS — S301XXA Contusion of abdominal wall, initial encounter: Secondary | ICD-10-CM | POA: Insufficient documentation

## 2020-03-19 DIAGNOSIS — R079 Chest pain, unspecified: Secondary | ICD-10-CM | POA: Diagnosis not present

## 2020-03-19 DIAGNOSIS — Y939 Activity, unspecified: Secondary | ICD-10-CM | POA: Diagnosis not present

## 2020-03-19 DIAGNOSIS — E1142 Type 2 diabetes mellitus with diabetic polyneuropathy: Secondary | ICD-10-CM | POA: Diagnosis not present

## 2020-03-19 DIAGNOSIS — N202 Calculus of kidney with calculus of ureter: Secondary | ICD-10-CM | POA: Diagnosis not present

## 2020-03-19 DIAGNOSIS — Y929 Unspecified place or not applicable: Secondary | ICD-10-CM | POA: Diagnosis not present

## 2020-03-19 DIAGNOSIS — Y999 Unspecified external cause status: Secondary | ICD-10-CM | POA: Diagnosis not present

## 2020-03-19 DIAGNOSIS — K760 Fatty (change of) liver, not elsewhere classified: Secondary | ICD-10-CM | POA: Diagnosis not present

## 2020-03-19 DIAGNOSIS — N200 Calculus of kidney: Secondary | ICD-10-CM

## 2020-03-19 DIAGNOSIS — S3092XA Unspecified superficial injury of abdominal wall, initial encounter: Secondary | ICD-10-CM | POA: Diagnosis present

## 2020-03-19 LAB — COMPREHENSIVE METABOLIC PANEL
ALT: 74 U/L — ABNORMAL HIGH (ref 0–44)
AST: 47 U/L — ABNORMAL HIGH (ref 15–41)
Albumin: 5.1 g/dL — ABNORMAL HIGH (ref 3.5–5.0)
Alkaline Phosphatase: 84 U/L (ref 38–126)
Anion gap: 13 (ref 5–15)
BUN: 13 mg/dL (ref 6–20)
CO2: 25 mmol/L (ref 22–32)
Calcium: 9.8 mg/dL (ref 8.9–10.3)
Chloride: 99 mmol/L (ref 98–111)
Creatinine, Ser: 0.62 mg/dL (ref 0.61–1.24)
GFR calc Af Amer: >60 mL/min (ref >60)
GFR calc non Af Amer: >60 mL/min (ref >60)
Glucose, Bld: 323 mg/dL — ABNORMAL HIGH (ref 70–99)
Potassium: 4 mmol/L (ref 3.5–5.1)
Sodium: 137 mmol/L (ref 135–145)
Total Bilirubin: 1.1 mg/dL (ref 0.3–1.2)
Total Protein: 8.9 g/dL — ABNORMAL HIGH (ref 6.5–8.1)

## 2020-03-19 LAB — CBC WITH DIFFERENTIAL/PLATELET
Abs Immature Granulocytes: 0.02 10*3/uL (ref 0.00–0.07)
Basophils Absolute: 0 10*3/uL (ref 0.0–0.1)
Basophils Relative: 1 %
Eosinophils Absolute: 0 10*3/uL (ref 0.0–0.5)
Eosinophils Relative: 0 %
HCT: 49.2 % (ref 39.0–52.0)
Hemoglobin: 17.2 g/dL — ABNORMAL HIGH (ref 13.0–17.0)
Immature Granulocytes: 0 %
Lymphocytes Relative: 32 %
Lymphs Abs: 1.6 10*3/uL (ref 0.7–4.0)
MCH: 31.3 pg (ref 26.0–34.0)
MCHC: 35 g/dL (ref 30.0–36.0)
MCV: 89.5 fL (ref 80.0–100.0)
Monocytes Absolute: 0.3 10*3/uL (ref 0.1–1.0)
Monocytes Relative: 5 %
Neutro Abs: 3.1 10*3/uL (ref 1.7–7.7)
Neutrophils Relative %: 62 %
Platelets: 262 10*3/uL (ref 150–400)
RBC: 5.5 MIL/uL (ref 4.22–5.81)
RDW: 12.4 % (ref 11.5–15.5)
WBC: 5 10*3/uL (ref 4.0–10.5)
nRBC: 0 % (ref 0.0–0.2)

## 2020-03-19 LAB — TROPONIN I (HIGH SENSITIVITY): Troponin I (High Sensitivity): <2 ng/L (ref <18)

## 2020-03-19 MED ORDER — IOHEXOL 300 MG/ML  SOLN
100.0000 mL | Freq: Once | INTRAMUSCULAR | Status: AC | PRN
  Administered 2020-03-19: 100 mL via INTRAVENOUS

## 2020-03-19 MED ORDER — ONDANSETRON HCL 4 MG/2ML IJ SOLN
4.0000 mg | Freq: Once | INTRAMUSCULAR | Status: AC
  Administered 2020-03-19: 4 mg via INTRAVENOUS
  Filled 2020-03-19: qty 2

## 2020-03-19 MED ORDER — MORPHINE SULFATE (PF) 4 MG/ML IV SOLN
4.0000 mg | Freq: Once | INTRAVENOUS | Status: AC
  Administered 2020-03-19: 4 mg via INTRAVENOUS
  Filled 2020-03-19: qty 1

## 2020-03-19 MED ORDER — SODIUM CHLORIDE (PF) 0.9 % IJ SOLN
INTRAMUSCULAR | Status: AC
  Filled 2020-03-19: qty 50

## 2020-03-19 MED ORDER — SODIUM CHLORIDE 0.9 % IV BOLUS
500.0000 mL | Freq: Once | INTRAVENOUS | Status: AC
  Administered 2020-03-19: 500 mL via INTRAVENOUS
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
## 2020-03-19 NOTE — Discharge Instructions (Signed)
Su tomografa computarizada muestra un hematoma en su lado izquierdo. Esto es probablemente lo que le est causando dolor hoy. Puede alternar compresas fras y calientes en esta rea durante 20 minutos a la vez. Puede tomar ibuprofeno cada 6 a 8 horas para los dolores corporales.  Por cierto, tiene un clculo renal muy grande en el rin izquierdo. Debe hacer un seguimiento con un especialista para esto, consulte urologa.  Tambin tiene enfermedad del hgado graso, limite el uso de Tylenol y la ingesta de alcohol. Su mdico de atencin primaria puede ocuparse de esto, as como de su presin arterial y diabetes.  Regrese a la sala de emergencias si los sntomas empeoraron o [place]; de lo contrario, consulte a su mdico para que lo revise.   Your CT scan shows a bruise in your left side. This is probably what is causing your pain today. You can alternate warm and cold compresses to this area for 20 minutes at a time. You can take Ibuprofen every 6-8 hours for body aches.  Incidentally, you have a very large kidney stone in your left kidney. You should follow up with a specialist for this, please see urology.  You also have fatty liver disease, limit Tylenol use and alcohol intake. Your primary care doctor can take care of this as well as your blood pressure and diabetes.   Return to the ER for worsening or concerning symptoms, otherwise, see your doctor for recheck.
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
## 2021-12-21 IMAGING — CR DG CHEST 2V
2 series · 2 of 2 positions shown · non-contrast
Comparison: None.

CLINICAL DATA: Chest pain

EXAM:
CHEST - 2 VIEW

[w chest pa]
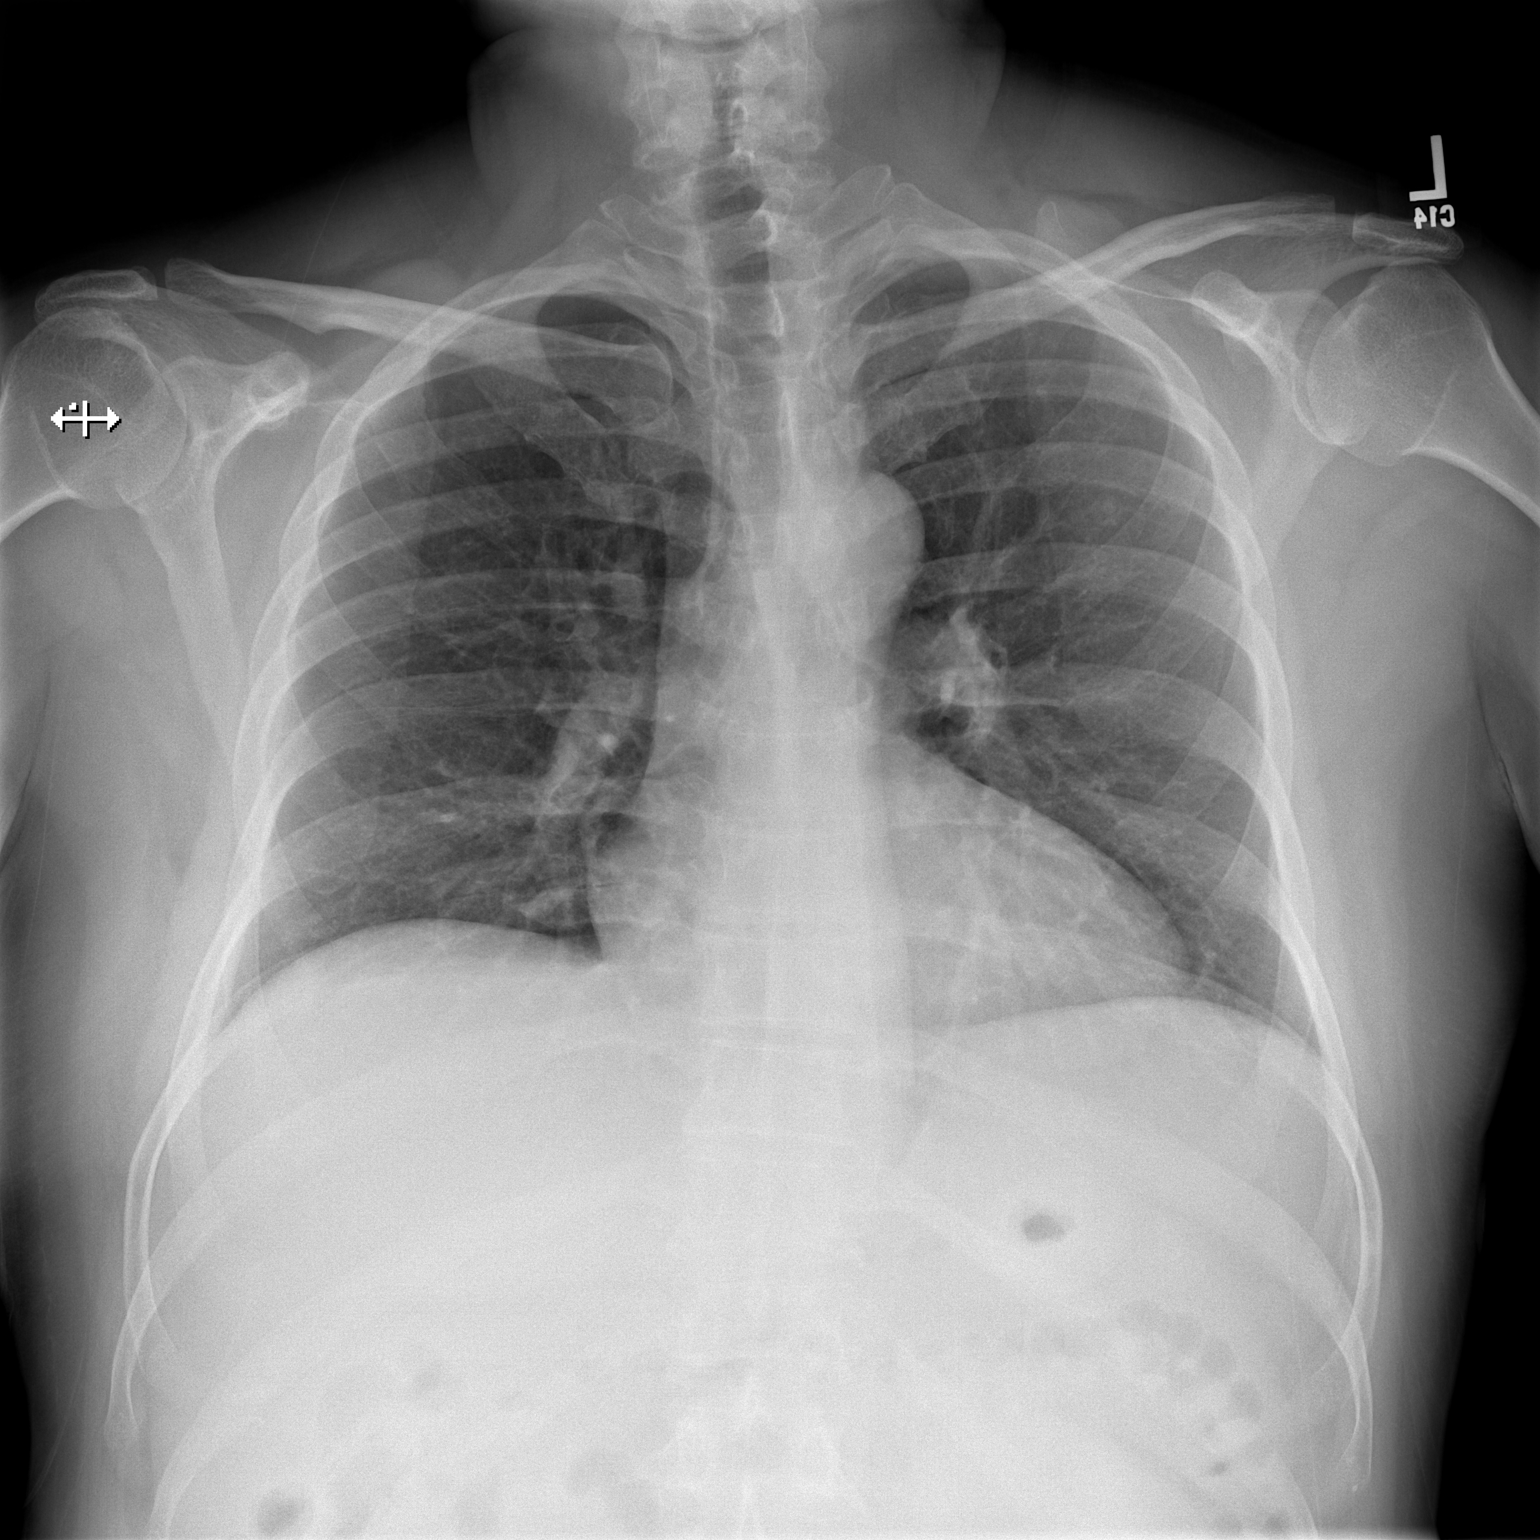

[w chest lat]
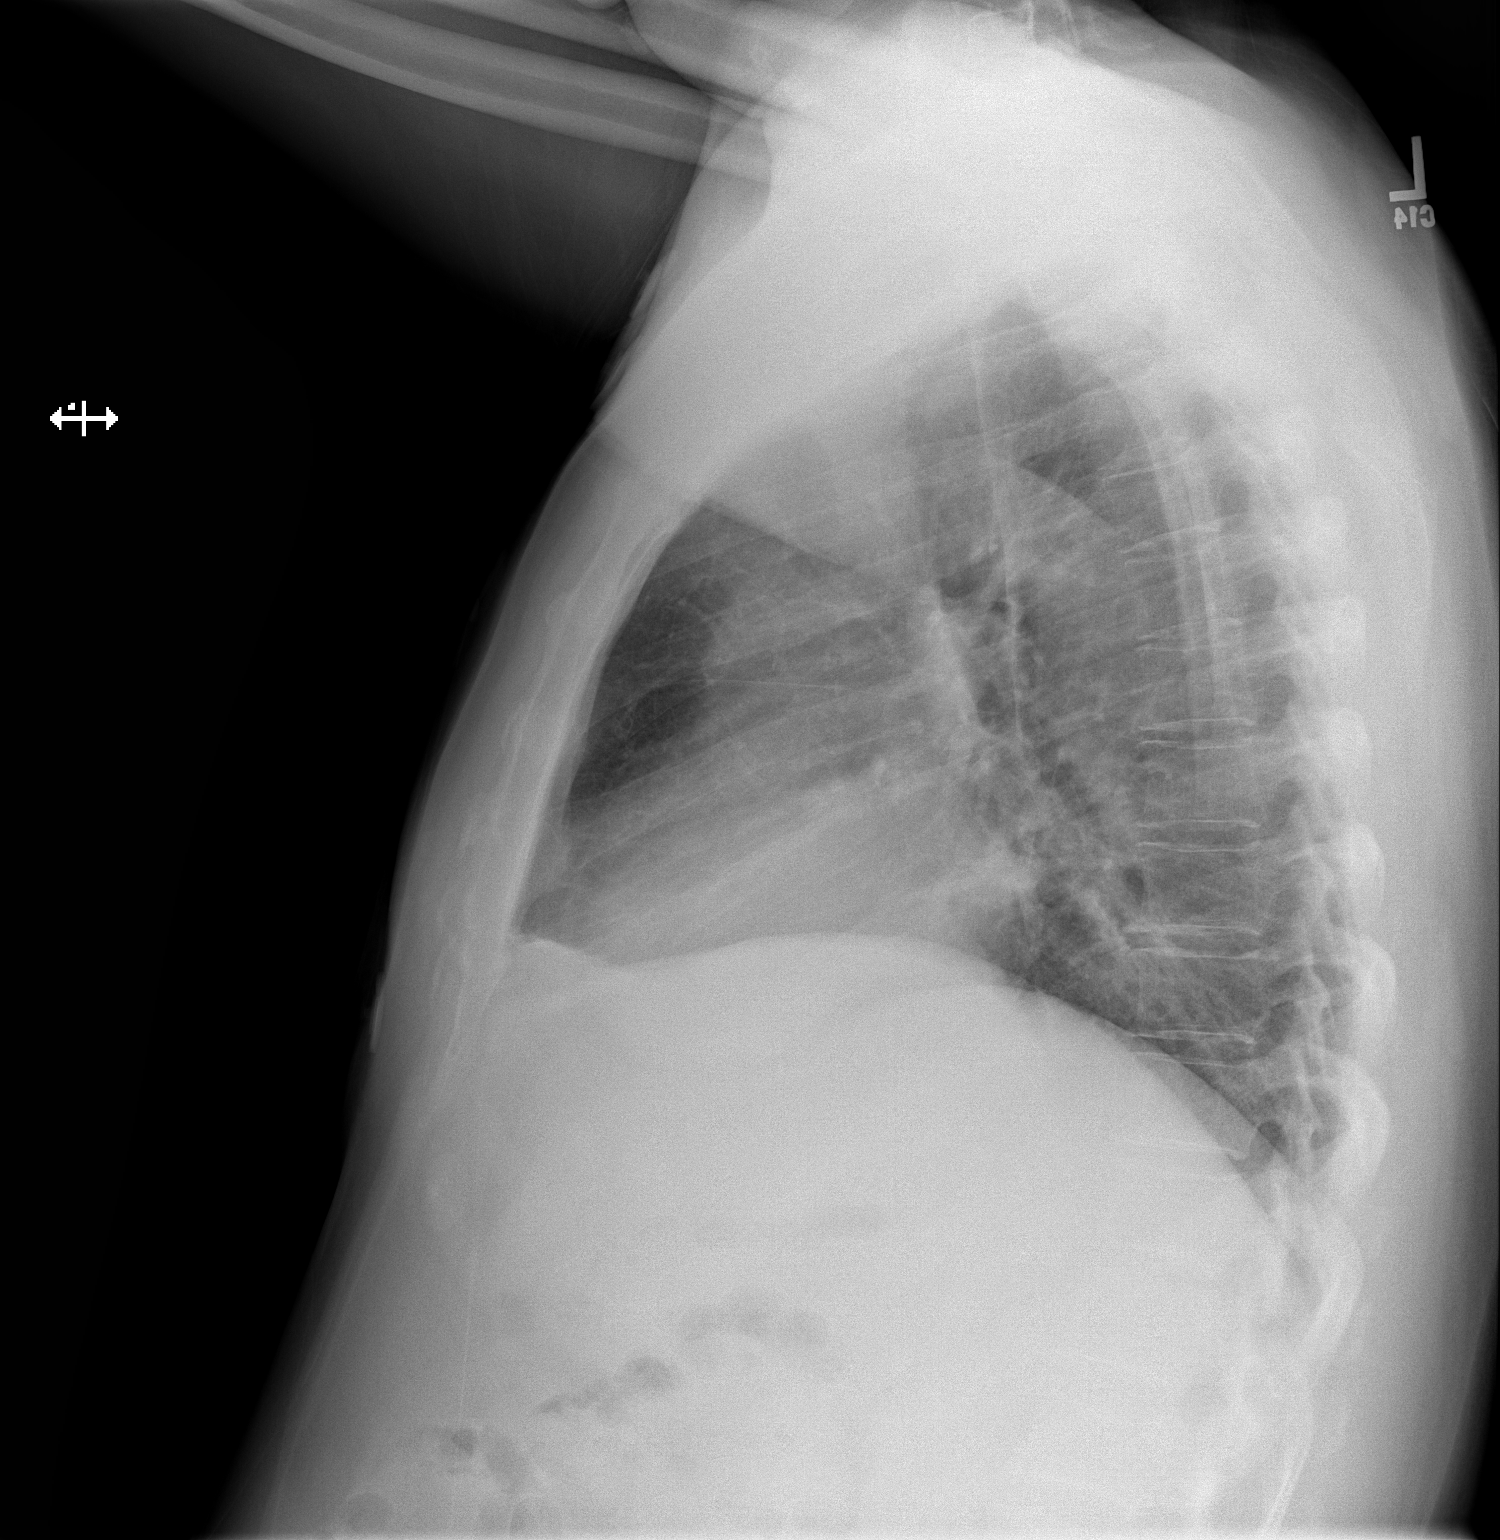

[2 of 2 positions shown; findings below may reference images not displayed]

FINDINGS: The heart size and mediastinal contours are within normal limits.
Both lungs are clear. The visualized skeletal structures are
unremarkable.
IMPRESSION: No active cardiopulmonary disease.
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
